# Patient Record
Sex: Male | Born: 1995 | Race: White | Hispanic: No | Marital: Single
Health system: Southern US, Community
[De-identification: ages and names within clinical notes are randomized; demographics above are authoritative.]

---

## 2020-01-06 ENCOUNTER — Emergency Department (HOSPITAL_COMMUNITY): Payer: Self-pay

## 2020-01-06 ENCOUNTER — Other Ambulatory Visit: Payer: Self-pay

## 2020-01-06 ENCOUNTER — Encounter (HOSPITAL_COMMUNITY): Payer: Self-pay | Admitting: Emergency Medicine

## 2020-01-06 ENCOUNTER — Emergency Department (HOSPITAL_COMMUNITY)
Admission: EM | Admit: 2020-01-06 | Discharge: 2020-01-06 | Disposition: A | Discharge status: Home / Self Care | Payer: Self-pay | Attending: Emergency Medicine | Admitting: Emergency Medicine

## 2020-01-06 DIAGNOSIS — M545 Low back pain, unspecified: Secondary | ICD-10-CM | POA: Insufficient documentation

## 2020-01-06 DIAGNOSIS — S0219XA Other fracture of base of skull, initial encounter for closed fracture: Secondary | ICD-10-CM | POA: Insufficient documentation

## 2020-01-06 DIAGNOSIS — S0990XA Unspecified injury of head, initial encounter: Secondary | ICD-10-CM

## 2020-01-06 DIAGNOSIS — S161XXA Strain of muscle, fascia and tendon at neck level, initial encounter: Secondary | ICD-10-CM | POA: Insufficient documentation

## 2020-01-06 DIAGNOSIS — S069X9A Unspecified intracranial injury with loss of consciousness of unspecified duration, initial encounter: Secondary | ICD-10-CM | POA: Insufficient documentation

## 2020-01-06 MED ORDER — IBUPROFEN 800 MG PO TABS
800.0000 mg | ORAL_TABLET | Freq: Once | ORAL | Status: AC
  Administered 2020-01-06: 800 mg via ORAL
  Filled 2020-01-06: qty 1

## 2020-01-06 MED ORDER — IBUPROFEN 800 MG PO TABS: 800.0000 mg | ORAL_TABLET | Freq: Three times a day (TID) | ORAL | 0 refills | Status: AC | PRN
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
## 2020-01-15 ENCOUNTER — Other Ambulatory Visit: Payer: Self-pay

## 2020-01-15 ENCOUNTER — Emergency Department (HOSPITAL_COMMUNITY)
Admission: EM | Admit: 2020-01-15 | Discharge: 2020-01-15 | Disposition: A | Discharge status: Home / Self Care | Payer: Self-pay | Attending: Emergency Medicine | Admitting: Emergency Medicine

## 2020-01-15 DIAGNOSIS — J069 Acute upper respiratory infection, unspecified: Secondary | ICD-10-CM | POA: Insufficient documentation

## 2020-01-15 DIAGNOSIS — Z20822 Contact with and (suspected) exposure to covid-19: Secondary | ICD-10-CM | POA: Insufficient documentation

## 2020-01-15 DIAGNOSIS — R0602 Shortness of breath: Secondary | ICD-10-CM | POA: Insufficient documentation

## 2020-01-15 LAB — SARS CORONAVIRUS 2 (TAT 6-24 HRS): SARS Coronavirus 2: NEGATIVE

## 2020-01-15 LAB — POC SARS CORONAVIRUS 2 AG -  ED: SARS Coronavirus 2 Ag: NEGATIVE

## 2020-01-15 MED ORDER — ALBUTEROL SULFATE HFA 108 (90 BASE) MCG/ACT IN AERS
2.0000 | INHALATION_SPRAY | Freq: Once | RESPIRATORY_TRACT | Status: AC
  Administered 2020-01-15: 2 via RESPIRATORY_TRACT
  Filled 2020-01-15: qty 6.7
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
## 2020-01-15 NOTE — Discharge Instructions (Signed)
You were tested for Covid.  Results should return in 6 to 24 hours.  You will receive a phone call if it is positive.  You will not hear anything if it is negative.  Either way, you may check online on MyChart. Regardless, this is likely a viral illness, which should be treated symptomatically.  Use Tylenol or ibuprofen as needed for fevers, headaches, or body aches.  Use cough syrup as needed.  Use the albuterol inhaler as needed for shortness of breath, chest tightness, wheezing. Make sure you stay well-hydrated with water.  Wash your hands frequently to prevent spread of infection.   If your test is positive, you will need to quarantine for a total of 5 days from symptom onset.  You may end quarantine if you are fever free and your symptoms are improving, however it is extremely important that you wear a mask for an additional 5 days at all times. If you are not fever free your symptoms are not improving, you will need to quarantine until this is the case    Return to the emergency room if you develop chest pain, difficulty breathing, or any new or worsening symptoms.

## 2020-01-15 NOTE — ED Provider Notes (Signed)
[facility] EMERGENCY DEPARTMENT Provider Note   CSN: [id number] Arrival date & time: 01/15/20  1201     History Chief Complaint  Patient presents with  . Cough    Frederick Patton is a 25 y.o. male presenting for evaluation of cough, nasal congestion, fever, body aches.  Patient states for the past 3 to 4 days, he has not been feeling well. He reports fevers, cough, nasal congestion. He also reports intermittent chest tightness and shortness of breath. He reports somebody in the house that he lives that is COVID-positive. He also states he has been very cold recently, is concerned he has a viral illness. He is not vaccinated against COVID. He has taken Mucinex and ibuprofen without significant improvement of symptoms. He has not tried any else. He reports a history of hypertension and mental health problems, no other medical problems. No history of asthma or COPD, having to smoke cigarettes regularly.  HPI     No past medical history on file.  There are no problems to display for this patient.   No past surgical history on file.     No family history on file.     Home Medications Prior to Admission medications   Medication Sig Start Date End Date Taking? Authorizing Provider  ibuprofen (ADVIL) 800 MG tablet Take 1 tablet (800 mg total) by mouth every 8 (eight) hours as needed. 01/06/20   Terrilee Files, MD    Allergies    Patient has no known allergies.  Review of Systems   Review of Systems  Constitutional: Positive for fever.  HENT: Positive for congestion.   Respiratory: Positive for cough and shortness of breath.   Cardiovascular: Positive for chest pain.    Physical Exam Updated Vital Signs BP (!) 156/92   Pulse 98   Temp 98.9 F (37.2 C) (Oral)   Resp 14   SpO2 100%   Physical Exam Vitals and nursing note reviewed.  Constitutional:      General: He is not in acute distress.    Appearance: He is well-developed and  well-nourished.     Comments: Resting in the bed in no acute distress  HENT:     Head: Normocephalic and atraumatic.  Eyes:     Extraocular Movements: Extraocular movements intact and EOM normal.     Conjunctiva/sclera: Conjunctivae normal.     Pupils: Pupils are equal, round, and reactive to light.  Cardiovascular:     Rate and Rhythm: Normal rate and regular rhythm.     Pulses: Normal pulses and intact distal pulses.  Pulmonary:     Effort: Pulmonary effort is normal. No respiratory distress.     Breath sounds: Wheezing present.     Comments: Expiratory wheezes in the left side of the lung. Speaking full sentences. Sats stable on room air. No respiratory distress. Abdominal:     General: There is no distension.     Palpations: Abdomen is soft. There is no mass.     Tenderness: There is no abdominal tenderness. There is no guarding or rebound.  Musculoskeletal:        General: Normal range of motion.     Cervical back: Normal range of motion and neck supple.  Skin:    General: Skin is warm and dry.     Capillary Refill: Capillary refill takes less than 2 seconds.  Neurological:     Mental Status: He is alert and oriented to person, place, and time.  Psychiatric:  Mood and Affect: Mood and affect normal.     ED Results / Procedures / Treatments   Labs (all labs ordered are listed, but only abnormal results are displayed) Labs Reviewed  SARS CORONAVIRUS 2 (TAT 6-24 HRS)  POC SARS CORONAVIRUS 2 AG -  ED    EKG None  Radiology No results found.  Procedures Procedures (including critical care time)  Medications Ordered in ED Medications  albuterol (VENTOLIN HFA) 108 (90 Base) MCG/ACT inhaler 2 puff (2 puffs Inhalation Given 01/15/20 1433)    ED Course  I have reviewed the triage vital signs and the nursing notes.  Pertinent labs & imaging results that were available during my care of the patient were reviewed by me and considered in my medical decision making  (see chart for details).    MDM Rules/Calculators/A&P                          Patient presented for evaluation of 3 to 4-day history of URI symptoms. On exam, patient peers nontoxic. He does have wheezing on the left side, no history of asthma or COPD, however he does smoke cigarettes regularly. As he is having intermittent chest pain on left side, will give albuterol to see if this relieves chest pain and wheezing. If not, patient may need chest x-ray. Rapid COVID test was negative, however will order send out PCR test, as he still may be positive. PERC negative, doubt PE. Exam and history not c/w ACS, as pain is intermittent and worse with coughing. Likely msk.   On reassessment after albuterol, wheezing is resolved and he reports symptoms are improved.  As such, likely lung irritation due to viral illness and smoking.  Discussed continued symptomatic treatment at home.  Discussed that COVID test is pending and importance of quarantine instructions.  At this time, patient appears safe for discharge. Return precautions given.  Patient states understands and agrees to plan  Frederick Patton was evaluated in Emergency Department on 01/15/2020 for the symptoms described in the history of present illness. He was evaluated in the context of the global COVID-19 pandemic, which necessitated consideration that the patient might be at risk for infection with the SARS-CoV-2 virus that causes COVID-19. Institutional protocols and algorithms that pertain to the evaluation of patients at risk for COVID-19 are in a state of rapid change based on information released by regulatory bodies including the CDC and federal and state organizations. These policies and algorithms were followed during the patient's care in the ED.  Final Clinical Impression(s) / ED Diagnoses Final diagnoses:  Viral URI with cough  Close exposure to COVID-19 virus    Rx / DC Orders ED Discharge Orders    None       Alveria Apley,  PA-C 01/15/20 1443    Linwood Dibbles, MD 01/16/20 [phone]
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
